# Patient Record
Sex: Male | Born: 1970 | Race: White | Hispanic: No | Marital: Single | Smoking: Never smoker
Health system: Southern US, Community
[De-identification: ages and names within clinical notes are randomized; demographics above are authoritative.]

## PROBLEM LIST (undated history)

## (undated) DIAGNOSIS — I10 Essential (primary) hypertension: Secondary | ICD-10-CM

## (undated) DIAGNOSIS — E785 Hyperlipidemia, unspecified: Secondary | ICD-10-CM

## (undated) HISTORY — DX: Essential (primary) hypertension: I10

---
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
## 1999-05-27 ENCOUNTER — Ambulatory Visit (HOSPITAL_COMMUNITY): Admission: RE | Admit: 1999-05-27 | Discharge: 1999-05-27 | Payer: Self-pay | Admitting: Internal Medicine

## 1999-05-27 ENCOUNTER — Encounter: Payer: Self-pay | Admitting: Internal Medicine

## 2005-10-02 ENCOUNTER — Ambulatory Visit: Payer: Self-pay | Admitting: Emergency Medicine

## 2005-10-26 ENCOUNTER — Ambulatory Visit: Payer: Self-pay | Admitting: Emergency Medicine

## 2006-01-23 ENCOUNTER — Ambulatory Visit: Payer: Self-pay | Admitting: Emergency Medicine

## 2006-01-30 ENCOUNTER — Ambulatory Visit: Payer: Self-pay | Admitting: Cardiovascular Disease

## 2006-02-20 ENCOUNTER — Ambulatory Visit: Payer: Self-pay | Admitting: Emergency Medicine

## 2006-03-05 ENCOUNTER — Ambulatory Visit: Payer: Self-pay | Admitting: Emergency Medicine

## 2006-03-05 ENCOUNTER — Encounter (INDEPENDENT_AMBULATORY_CARE_PROVIDER_SITE_OTHER): Payer: Self-pay | Admitting: Specialist

## 2006-03-05 ENCOUNTER — Ambulatory Visit (HOSPITAL_COMMUNITY): Admission: RE | Admit: 2006-03-05 | Discharge: 2006-03-05 | Payer: Self-pay | Admitting: Emergency Medicine

## 2007-03-20 DIAGNOSIS — R059 Cough, unspecified: Secondary | ICD-10-CM | POA: Insufficient documentation

## 2007-03-20 DIAGNOSIS — R05 Cough: Secondary | ICD-10-CM | POA: Insufficient documentation

## 2007-03-20 DIAGNOSIS — E785 Hyperlipidemia, unspecified: Secondary | ICD-10-CM | POA: Insufficient documentation

## 2007-03-20 DIAGNOSIS — J309 Allergic rhinitis, unspecified: Secondary | ICD-10-CM | POA: Insufficient documentation

## 2008-01-13 IMAGING — CT CT CHEST W/O CM
3 of 5 series · 16 of 36 positions shown, 19 images · IV contrast (agent unspecified)
Comparison: None.

CLINICAL DATA: 17-pound weight loss.  Cough.  Substernal pressure.  
 CHEST CT WITHOUT CONTRAST:
TECHNIQUE: Multidetector CT imaging of the chest was performed following the standard protocol without IV contrast.

[Series 2: chest_hi_res 5.0 b40f st · axial · 0.75mm/px · z∈[-339,-94]mm · 9 of 63 slices shown, 12 images]
[im 7/63  mediastinal]
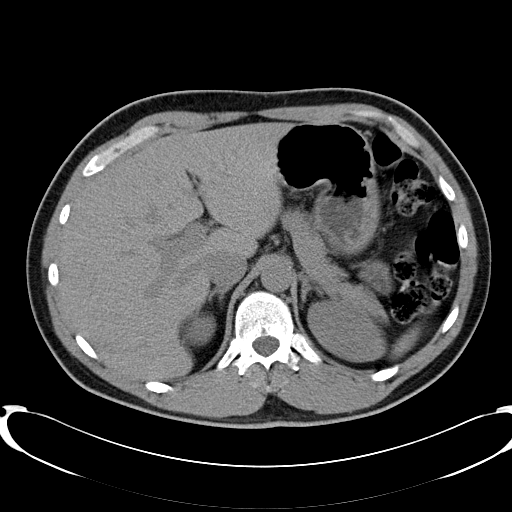
[im 7/63  lung]
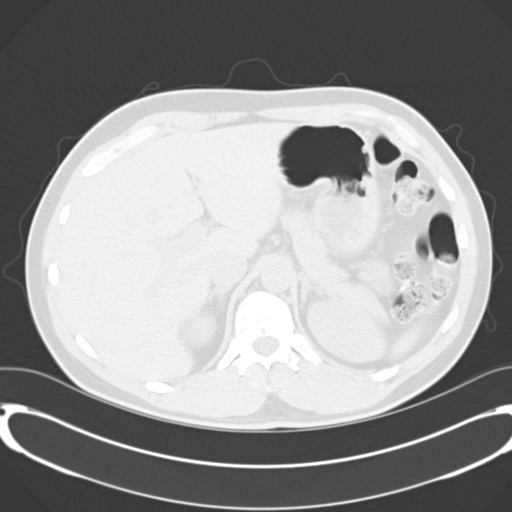
[im 13/63  lung]
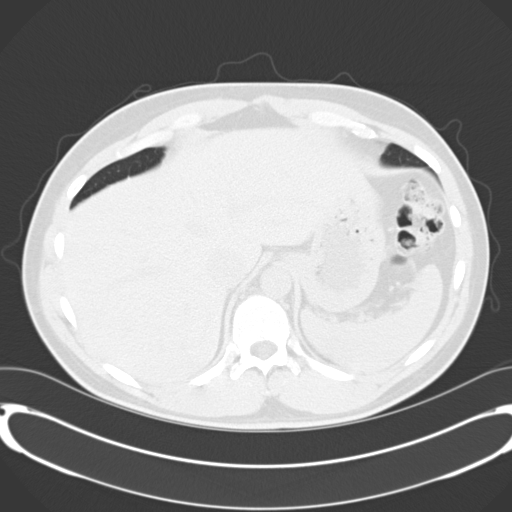
[im 19/63  lung]
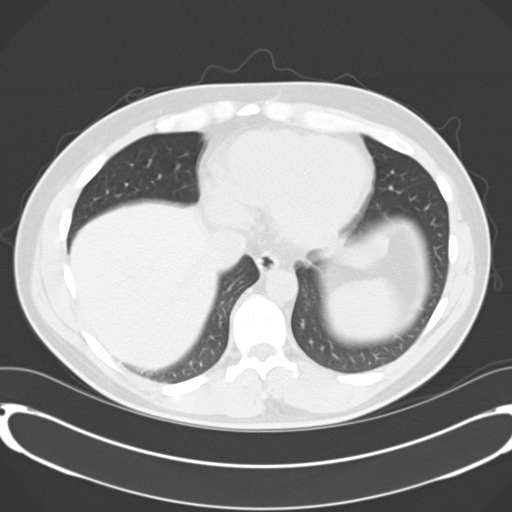
[im 25/63  lung]
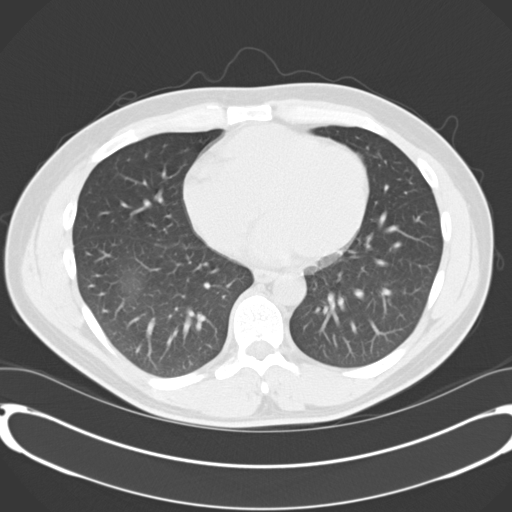
[im 32/63  mediastinal]
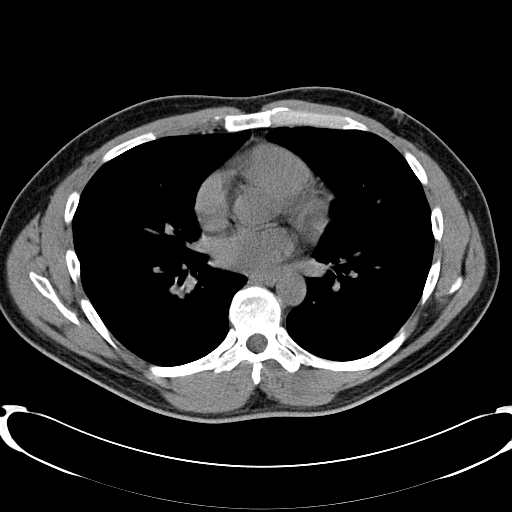
[im 32/63  lung]
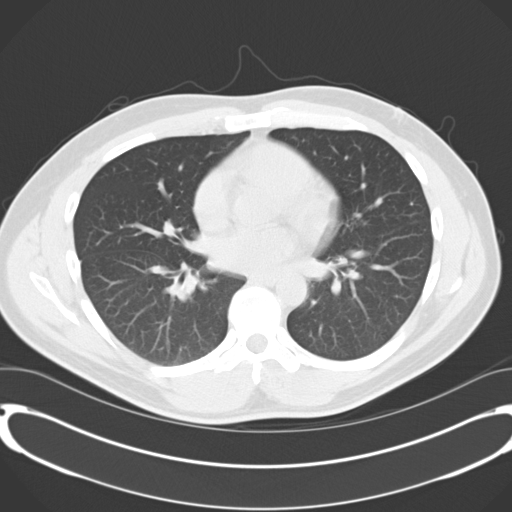
[im 38/63  lung]
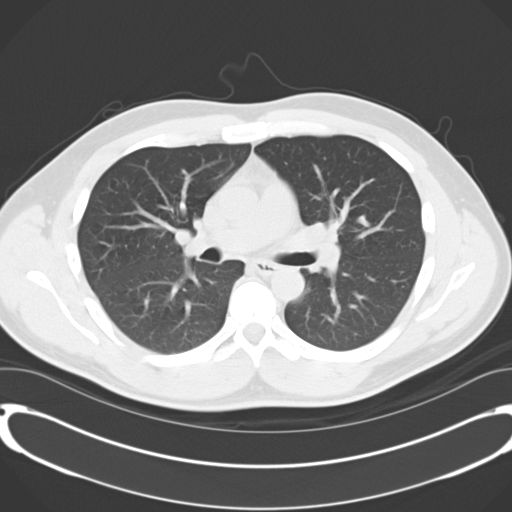
[im 44/63  lung]
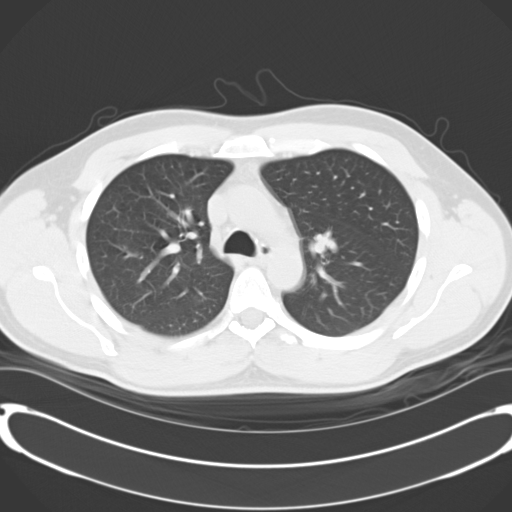
[im 50/63  lung]
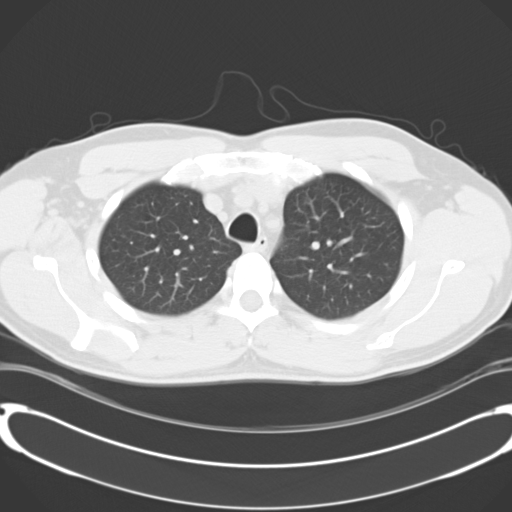
[im 56/63  mediastinal]
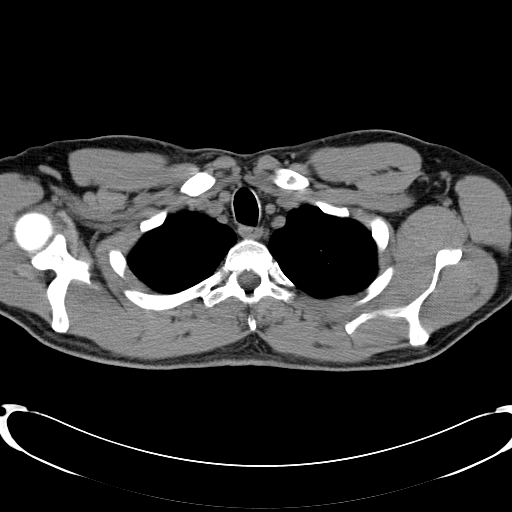
[im 56/63  lung]
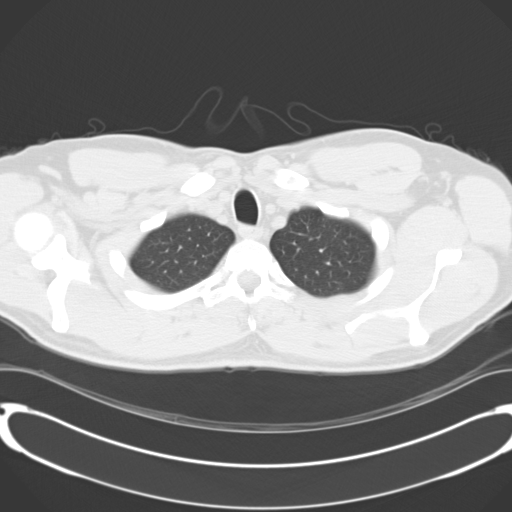

[Series 4: chest_hi_(id) b80f high res lung · axial · 0.75mm/px · z∈[-304,-124]mm · 4 of 32 slices shown]
[im 7/32  lung]
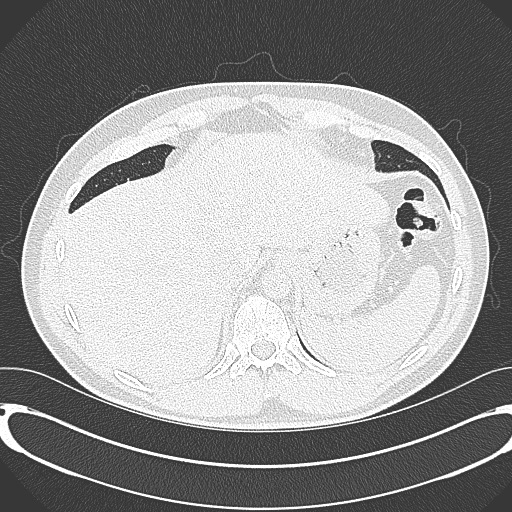
[im 13/32  lung]
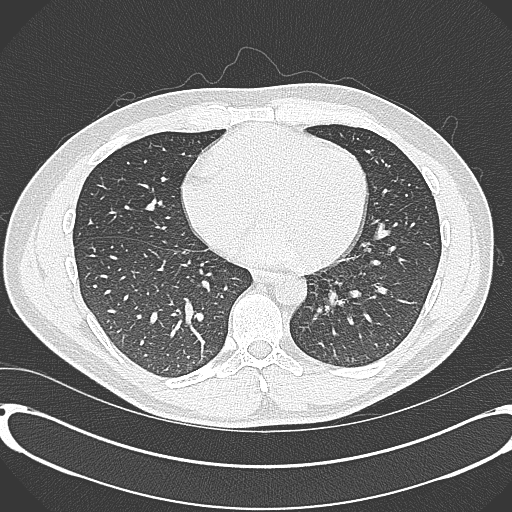
[im 19/32  lung]
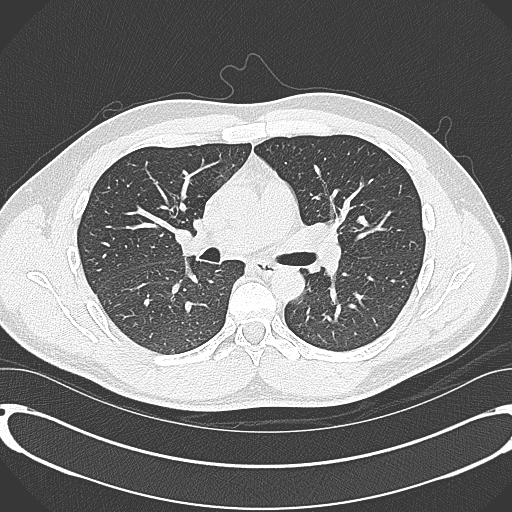
[im 25/32  lung]
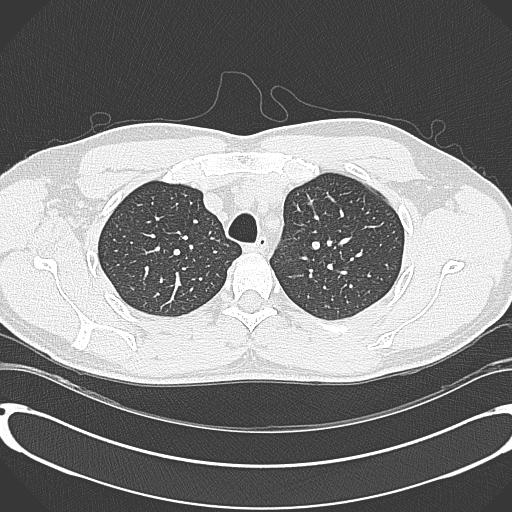

[Series 602: <mpr range> · coronal · 0.75mm/px · 3 of 39 slices shown]
[im 8/39  lung]
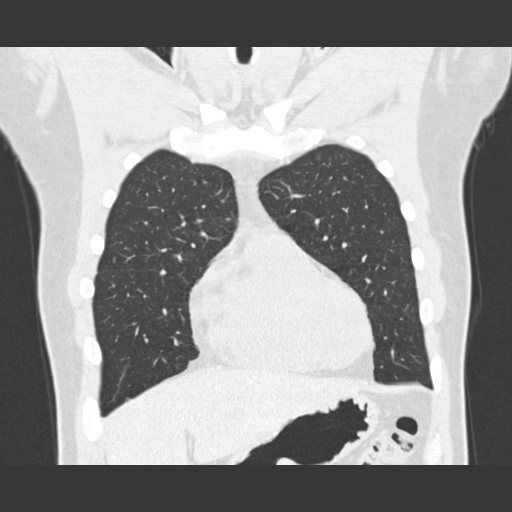
[im 16/39  lung]
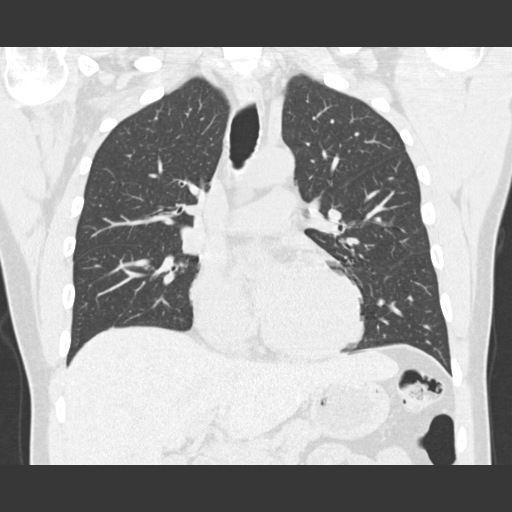
[im 23/39  lung]
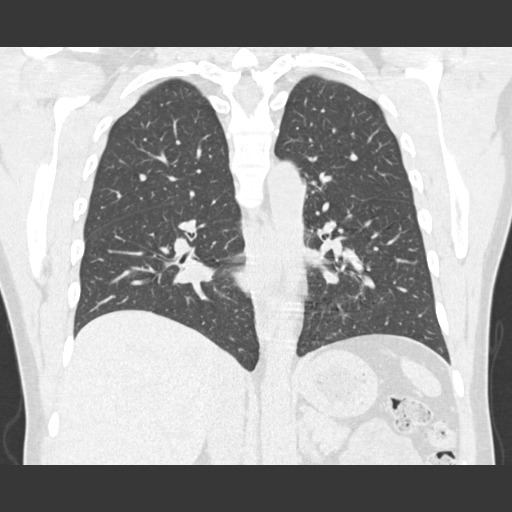

[16 of 36 positions shown; findings below may reference images not displayed]

FINDINGS: There is no pleural or pericardial effusion.  No hilar, axillary or mediastinal lymphadenopathy.  Heart size is normal.  The lungs are clear.  There is no evidence of pulmonary fibrosis or other interstitial lung disease.  No focal bony abnormality is identified with some small Schmorl?s nodes seen.  Incidentally imaged upper abdomen is normal.
IMPRESSION: Normal chest CT.

## 2008-11-22 ENCOUNTER — Encounter: Payer: Self-pay | Admitting: Internal Medicine

## 2009-04-05 ENCOUNTER — Encounter: Payer: Self-pay | Admitting: Internal Medicine

## 2009-07-14 ENCOUNTER — Encounter: Payer: Self-pay | Admitting: Internal Medicine

## 2009-07-20 ENCOUNTER — Ambulatory Visit: Payer: Self-pay | Admitting: Internal Medicine

## 2009-08-26 ENCOUNTER — Ambulatory Visit: Payer: Self-pay | Admitting: Internal Medicine

## 2009-08-26 ENCOUNTER — Ambulatory Visit: Payer: Self-pay

## 2010-06-28 NOTE — Assessment & Plan Note (Signed)
Summary: dx:cad   Primary Provider:  Georgette Shell at [facility]  CC:  family Hx.  History of Present Illness: 40 y/o male with h/o hyperlipidemia and very strong family history of premature CAD referred for screening for ischemic heart disease.  Had treadmill test for severe CP about 10 years with Dr. Oliver Barre. That was normal.   Very active works as a Buyer, retail. Walks 15-20 miles per day. Started running last year. Runs 3 miles 3-4x/week. No CP/SOB. Ran half marathon in 1:40. Has lost 25 pounds in last year and a half.   Very strong FHx of CAD on both sides but most of relatives smokers or diabetics.  Recent LDL 115 so lipitor increased 20 to 40.    Preventive Screening-Counseling & Management  Alcohol-Tobacco     Smoking Status: never  Caffeine-Diet-Exercise     Does Patient Exercise: yes      Drug Use:  yes.    Current Medications (verified): 1)  Aspirin 81 Mg Tbec (Aspirin) .... Take One Tablet By Mouth Daily 2)  Metformin Hcl 500 Mg Tabs (Metformin Hcl) .... Once Daily 3)  Lipitor 20 Mg Tabs (Atorvastatin Calcium) .... Take One Tablet By Mouth Daily.  Allergies (verified): No Known Drug Allergies  Past History:  Past Medical History: Last updated: 07/20/2009 1. COUGH, CHRONIC  2. HYPERLIPIDEMIA 3. ALLERGIC RHINITIS   4. Family Hx of CAD  Family History: Last updated: 07/20/2009 Family History of Coronary Artery Disease:  Strong on both sides of family Family History of Diabetes: grandfather Family History of Hyperlipidemia: both parents Family History of Hypertension: both parents Father alive 64. CAD had MI 62s smoker Grandfather and 2 uncles all died of  1 brother - with DM2, HL and obesity  Social History: Last updated: 07/20/2009 Full Time Married  Tobacco Use - No.  Alcohol Use - no Regular Exercise - yes Drug Use - none  Risk Factors: Exercise: yes (07/20/2009)  Risk Factors: Smoking Status: never (07/20/2009)  Past  Surgical History: shoulder -- tendons  Family History: Reviewed history and no changes required. Family History of Coronary Artery Disease:  Strong on both sides of family Family History of Diabetes: grandfather Family History of Hyperlipidemia: both parents Family History of Hypertension: both parents Father alive 25. CAD had MI 58s smoker Grandfather and 2 uncles all died of  1 brother - with DM2, HL and obesity  Social History: Reviewed history and no changes required. Full Time Married  Tobacco Use - No.  Alcohol Use - no Regular Exercise - yes Drug Use - none Does Patient Exercise:  yes Drug Use:  yes  Review of Systems       As per HPI and past medical history; otherwise all systems negative.   Vital Signs:  Patient profile:   40 year old male Height:      69 inches Weight:      180 pounds BMI:     26.68 Pulse rate:   55 / minute BP sitting:   122 / 78  (left arm) Cuff size:   regular  Vitals Entered By: Hardin Negus, RMA (July 20, 2009 4:11 PM)  Physical Exam  General:  Active healthy appearing. no resp difficulty HEENT: normal Neck: supple. no JVD. Carotids 2+ bilat; no bruits. No lymphadenopathy or thryomegaly appreciated. Cor: PMI nondisplaced. Regular rate & rhythm. No rubs, gallops, murmur. Lungs: clear Abdomen: soft, nontender, nondistended. Good bowel sounds. Extremities: R shouldr surgery scar. no cyanosis, clubbing, rash, edema  Neuro: alert & orientedx3, cranial nerves grossly intact. moves all 4 extremities w/o difficulty. affect pleasant    Impression & Recommendations:  Problem # 1:  SCREENING FOR ISCHEMIC HEART DISEASE (ICD-V81.0) Very active . No symptoms of ischemic heart disease but has extensive FHx. Will get ETT for risk stratification.   Problem # 2:  HYPERLIPIDEMIA (ICD-272.4) Managed by Dr. Karleen Hampshire. Agree with aggressive statin therapy.   Other Orders: Treadmill (Treadmill)  Patient Instructions: 1)  Your physician  has requested that you have an exercise tolerance test.  For further information please visit [email or web address]  Please also follow instruction sheet, as given.

## 2010-06-28 NOTE — Progress Notes (Signed)
Summary: [facility] Office Note  [facility] Office Note   Imported By: Roderic Ovens 11/17/2009 10:33:45  _____________________________________________________________________  External Attachment:    Type:   Image     Comment:   External Document

## 2010-06-28 NOTE — Progress Notes (Signed)
Summary: [facility] Office Note  [facility]  Office Note   Imported By: Roderic Ovens 11/17/2009 10:34:09  _____________________________________________________________________  External Attachment:    Type:   Image     Comment:   External Document

## 2010-06-28 NOTE — Progress Notes (Signed)
Summary: [facility] Office Note  [facility] Office Note   Imported By: Roderic Ovens 11/17/2009 10:33:17  _____________________________________________________________________  External Attachment:    Type:   Image     Comment:   External Document

## 2010-10-14 NOTE — Assessment & Plan Note (Signed)
[facility]                               PULMONARY OFFICE NOTE   Adrian Huber, Adrian Huber                      MRN:          [id number]  DATE:02/20/2006                            DOB:          01/23/1971    SUBJECTIVE:  Adrian Huber is a 40 year old gentleman who has been followed  since May of 2007 with chronic cough.  His cough actually dates back to  November of 2006.  He has been tried empirically on bronchodilators and on  corticosteroids prior to our first appointment.  I have tried him  empirically on gastric acid suppression and on therapy for allergic  rhinitis.  None of these interventions have offered him any significant  relief.  He has now stopped taking his Nexium and his Rhinocort without any  real change in his cough.  He does still cough on a daily basis.  It is  somewhat improved during the day at this time, but it is still very  bothersome in the mornings.  He has a tickle in his throat that is  unchanged.  High resolution CT scan of the chest since our last visit was  completely unremarkable, without any evidence of parenchymal or airways  disease.   EXAM:  IN GENERAL:  This is a pleasant young gentleman in no distress on  room air.  His weight is 178 pounds, temperature 97.8, blood pressure  128/86, heart rate of 67, saturation 99% on room air.  LUNGS:  Clear to auscultation bilaterally.  NECK:  Without strider.  His exam is otherwise completely unremarkable.  He is on no medications  except Zyrtec 10 mg daily.   IMPRESSION:  Chronic cough since November of 2006 that has not had a clear  etiology and has not responded to empiric conservative therapy.   PLAN:  1. FiberOptic bronchoscopy to rule out any airways abnormality.  2. If the above is unrevealing, then I would perform a provocational      study, probably a methacholine to rule out occult airflow limitation.                                   Leslye Peer, MD   RSB/MedQ  DD:  02/20/2006  DT:  02/22/2006  Job #:  [id number]   cc:   [place], [place] [address] [facility], [phone]

## 2010-10-14 NOTE — Assessment & Plan Note (Signed)
[facility]                               PULMONARY OFFICE NOTE   MONTERRIO, GERST                      MRN:          [id number]  DATE:01/23/2006                            DOB:          09-06-1970    SUBJECTIVE:  [name] is a 40 year old man seen in May 2007 for chronic  cough.  He returns today telling me that he continues to have a  nonproductive cough and a tickle in his throat.  He is taking Nexium 40 mg  twice a day and also Zyrtec.  We had discussed increasing his regimen for  allergic rhinitis if he continued to cough but unfortunately when he  contacted our office, we did not follow up on these plans.  He has not  started a nasal steroid or nasal saline washes as we had initially planned  to do if the cough continued.  He is otherwise doing well.  He continues to  work.  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   CURRENT MEDICATIONS:  1. Nexium 40 mg b.i.d.  2. Zyrtec 10 mg daily.   PHYSICAL EXAMINATION:  This is a well-appearing, pleasant young man who is  in no distress on room air.  HEENT:  There is no stridor or lymphadenopathy.  LUNGS:  Clear to auscultation bilaterally without wheezing.  HEART:  Regular rate and rhythm without murmur.  ABDOMEN:  Benign.  EXTREMITIES:  No cyanosis, clubbing, or edema.  NEUROLOGIC:  Grossly nonfocal exam.   As detailed in previous notes, his spirometry is for the most part normal  with only some very mild evidence for air flow limitation based on his FEV1  to FVC ratio.   IMPRESSION:  Chronic cough that persists despite adequate therapy for GERD  and at least partial therapy for his allergic rhinitis.   RECOMMENDATIONS:  1. High resolution CT scan of the chest to rule out bronchiolitis or      bronchiectasis given his mild air flow limitation on spirometry.  2. Add a nasal steroid to his Zyrtec.  We will start Nasacort AQ two      sprays each nostril daily.  3. Continue his Nexium at the current doses.  4. Consider a methacholine challenge if the patient continues to have      cough in absence of other explanation and if we remain  concerned about      possible occult air flow limitation.   I will follow up with Mr. Elvin after his high resolution CT scan of the  chest has been completed to review the results and to see if he has had any  benefit with the addition of his nasal steroid.                                   Leslye Peer, MD   RSB/MedQ  DD:  01/23/2006  DT:  01/24/2006  Job #:  [id number]   cc:   Dr. Andi Devon

## 2010-10-14 NOTE — Op Note (Signed)
NAME:  BRENNEN, Adrian Huber               ACCOUNT NO.:  [id number]   MEDICAL RECORD NO.:  [id number]          PATIENT TYPE:  AMB   LOCATION:  ENDO                         FACILITY:  [facility]   PHYSICIAN:  Leslye Peer, MD    DATE OF BIRTH:  04/05/1971   DATE OF PROCEDURE:  03/05/2006  DATE OF DISCHARGE:                                 OPERATIVE REPORT   PROCEDURE:  Fiberoptic bronchoscopy with biopsies and brushings.   OPERATOR:  Leslye Peer, MD   INDICATIONS FOR PROCEDURE:  The indication is chronic cough.  A consent was  obtained from the patient and a signed copy is on his hospital chart.   MEDICATIONS GIVEN:  1. Fentanyl 100 mcg IV in divided doses.  2. Versed 3 mg IV in divided doses.  3. Lidocaine 1%, 20 mL total, to the bronchoalveolar tree.  4. Cetacaine spray to the posterior pharynx x1.   PROCEDURE DETAILS:  An informed consent was obtained as above , and  conscious sedation was then initiated as detailed above.  Prior to local  anesthesia in the posterior pharynx,  the fiberoptic bronchoscope was  introduced through the oropharynx and the glottis and cords were inspected.  There was a small amount of exudative mucus present, but the cords moved  normally with phonation, deep inspiration and humming.  No polyps or  abnormalities were seen.  The bronchoscope was withdrawn and Cetacaine spray  was then introduced and the posterior pharynx.  The bronchoscope was  withdrawn  and Cetacaine spray was then introduced into the posterior  pharynx.  The bronchoscope was re-introduced through the oropharynx and the  trachea was intubated.  Lidocaine 1% was instilled into all airways for  local anesthesia.  The main carina was normal.  The right mainstem bronchus  and the entire right-sided exam was normal as well, without any evidence of  endobronchial lesion or abnormal secretions.  The left-sided exam showed  normal left lower lobe bronchus and segmental bronchi.  The lingula  was  quite prominent and was easily seen.  In contrast, the left upper lobe  bronchus was somewhat small and was laterally displaced.  I suspect that  this represents a normal anatomical variant.  The entrance to the left upper  lobe was slightly hypopigmented.  This observation and its lateral  displacement me to take endobronchial biopsies from the opening of the left  upper lobe bronchus.  Brushings were also obtained from this region and will  be sent for cytology.  Again, there were no masses noted on the entire left-  sided exam and there were no abnormal secretions.   The patient tolerated the procedure quite well.  There were no  complications, and blood loss was negligible.  He returned to the recovery  room in good condition.   SAMPLES:  1. Endobronchial biopsies from the left upper lobe.  2. Bronchial brushings from the left upper lobe.   PLAN:  Will follow up Mr. Watters's pathology and cytology results with him  by phone once they become available later this week.  ______________________________  Leslye Peer, MD     RSB/MEDQ  D:  03/05/2006  T:  03/06/2006  Job:  [id number]

## 2010-12-05 ENCOUNTER — Telehealth: Payer: Self-pay | Admitting: Internal Medicine

## 2010-12-05 NOTE — Telephone Encounter (Signed)
Stress faxed to [facility] [address] @ [phone]  12/05/10/km

## 2015-06-16 ENCOUNTER — Encounter: Payer: Self-pay | Admitting: Cardiology

## 2016-11-26 DEATH — deceased

## 2017-02-10 ENCOUNTER — Emergency Department (HOSPITAL_COMMUNITY): Payer: BLUE CROSS/BLUE SHIELD

## 2017-02-10 ENCOUNTER — Encounter (HOSPITAL_COMMUNITY): Payer: Self-pay | Admitting: Emergency Medicine

## 2017-02-10 ENCOUNTER — Emergency Department (HOSPITAL_COMMUNITY)
Admission: EM | Admit: 2017-02-10 | Discharge: 2017-02-11 | Disposition: A | Discharge status: Home / Self Care | Payer: BLUE CROSS/BLUE SHIELD | Attending: Emergency Medicine | Admitting: Emergency Medicine

## 2017-02-10 DIAGNOSIS — R1010 Upper abdominal pain, unspecified: Secondary | ICD-10-CM | POA: Insufficient documentation

## 2017-02-10 DIAGNOSIS — Z7982 Long term (current) use of aspirin: Secondary | ICD-10-CM | POA: Diagnosis not present

## 2017-02-10 DIAGNOSIS — R072 Precordial pain: Secondary | ICD-10-CM | POA: Diagnosis not present

## 2017-02-10 DIAGNOSIS — R079 Chest pain, unspecified: Secondary | ICD-10-CM | POA: Diagnosis present

## 2017-02-10 DIAGNOSIS — Z79899 Other long term (current) drug therapy: Secondary | ICD-10-CM | POA: Diagnosis not present

## 2017-02-10 HISTORY — DX: Hyperlipidemia, unspecified: E78.5

## 2017-02-10 LAB — BASIC METABOLIC PANEL
ANION GAP: 7 (ref 5–15)
BUN: 15 mg/dL (ref 6–20)
CHLORIDE: 101 mmol/L (ref 101–111)
CO2: 27 mmol/L (ref 22–32)
Calcium: 9.8 mg/dL (ref 8.9–10.3)
Creatinine, Ser: 1.04 mg/dL (ref 0.61–1.24)
GFR calc non Af Amer: >60 mL/min (ref >60)
GLUCOSE: 126 mg/dL — AB (ref 65–99)
POTASSIUM: 4.1 mmol/L (ref 3.5–5.1)
Sodium: 135 mmol/L (ref 135–145)

## 2017-02-10 LAB — CBC
HCT: 45.1 % (ref 39.0–52.0)
Hemoglobin: 15.2 g/dL (ref 13.0–17.0)
MCH: 29.5 pg (ref 26.0–34.0)
MCHC: 33.7 g/dL (ref 30.0–36.0)
MCV: 87.6 fL (ref 78.0–100.0)
PLATELETS: 301 10*3/uL (ref 150–400)
RBC: 5.15 MIL/uL (ref 4.22–5.81)
RDW: 13.7 % (ref 11.5–15.5)
WBC: 9.2 10*3/uL (ref 4.0–10.5)

## 2017-02-10 LAB — I-STAT TROPONIN, ED: Troponin i, poc: 0 ng/mL (ref 0.00–0.08)

## 2017-02-10 NOTE — ED Triage Notes (Signed)
Pt c/o 8/10 mid cp radiating to his back that stated today with some vomiting today, no cardiac hx.

## 2017-02-11 ENCOUNTER — Other Ambulatory Visit: Payer: Self-pay

## 2017-02-11 LAB — I-STAT TROPONIN, ED: TROPONIN I, POC: 0 ng/mL (ref 0.00–0.08)

## 2017-02-11 NOTE — ED Provider Notes (Signed)
[facility] DEPT Provider Note   CSN: [id number] Arrival date & time: 02/10/17  2036     History   Chief Complaint Chief Complaint  Patient presents with  . Chest Pain    HPI Adrian Huber is a 46 y.o. male.  HPI Patient is a 46 year old male with a history of hyperlipidemia and family history of early cardiac disease who presents emergency department with an episode of upper abdominal and lower chest discomfort with radiation through to his back.  This occurred this evening his been present for several hours.  His symptoms resolved in the emergency department waiting room.  Patient had similar symptoms transiently at the night before.  No associate shortness of breath.  No diaphoresis.  Tonight he became nauseated and vomited one time.  He's never been told he has gallstones.  He's never really had pain like this before.  He drinks 1 alcoholic beverage a night but does not drink heavier than that.  He works for a [employer] and walks daily and then never has chest pain or shortness of breath with walking.  No recent fatigue or weakness.  Asymptomatic at this time.   Past Medical History:  Diagnosis Date  . Hyperlipemia     Patient Active Problem List   Diagnosis Date Noted  . HYPERLIPIDEMIA 03/20/2007  . ALLERGIC RHINITIS 03/20/2007  . COUGH, CHRONIC 03/20/2007    History reviewed. No pertinent surgical history.     Home Medications    Prior to Admission medications   Medication Sig Start Date End Date Taking? Authorizing Provider  aspirin 81 MG EC tablet Take 81 mg by mouth every evening.   Yes [provider]  atorvastatin (LIPITOR) 20 MG tablet Take 20 mg by mouth every evening. 01/12/17  Yes [provider]  Omega-3 Fatty Acids (EQL OMEGA 3 FISH OIL) 1400 MG CAPS Take 1,400 mg by mouth every other day.   Yes [provider]    Family History History reviewed. No pertinent family history.  Social History Social History    Substance Use Topics  . Smoking status: Never Smoker  . Smokeless tobacco: Never Used  . Alcohol use 0.6 oz/week    1 Cans of beer per week     Allergies   Patient has no known allergies.   Review of Systems Review of Systems  All other systems reviewed and are negative.    Physical Exam Updated Vital Signs BP 129/87   Pulse (!) 51   Temp 98.2 F (36.8 C) (Oral)   Resp 12   Ht 5\' 8"  (1.727 m)   Wt 91.6 kg (202 lb)   SpO2 99%   BMI 30.71 kg/m   Physical Exam  Constitutional: He is oriented to person, place, and time. He appears well-developed and well-nourished.  HENT:  Head: Normocephalic.  Eyes: EOM are normal.  Neck: Normal range of motion.  Cardiovascular: Normal rate, regular rhythm and normal heart sounds.   Pulmonary/Chest: Effort normal and breath sounds normal. No respiratory distress.  Abdominal: He exhibits no distension.  Musculoskeletal: Normal range of motion.  Neurological: He is alert and oriented to person, place, and time.  Psychiatric: He has a normal mood and affect.  Nursing note and vitals reviewed.    ED Treatments / Results  Labs (all labs ordered are listed, but only abnormal results are displayed) Labs Reviewed  BASIC METABOLIC PANEL - Abnormal; Notable for the following:       Result Value  Glucose, Bld 126 (*)    All other components within normal limits  CBC  I-STAT TROPONIN, ED  I-STAT TROPONIN, ED    EKG  EKG Interpretation #1 Date/Time:  Saturday February 10 2017 20:43:08 EDT Ventricular Rate:  55 PR Interval:  150 QRS Duration: 112 QT Interval:  432 QTC Calculation: 413 R Axis:   -23 Text Interpretation:  Sinus bradycardia Otherwise normal ECG No old tracing to compare Confirmed by Delora Fuel ([id number]) on 02/10/2017 11:02:55 PM       EKG Interpretation  Date/Time:  Sunday February 11 2017 00:07:16 EDT Ventricular Rate:  49 PR Interval:  150 QRS Duration: 121 QT Interval:  466 QTC Calculation: 421 R  Axis:   -27 Text Interpretation:  Sinus bradycardia Nonspecific intraventricular conduction delay No significant change was found Confirmed by Jola Schmidt [phone]) on 02/11/2017 12:56:01 AM         Radiology Dg Chest 2 View  Result Date: 02/10/2017 CLINICAL DATA:  Mid chest pain radiating to the back starting today with vomiting. EXAM: CHEST  2 VIEW COMPARISON:  947 chest CT FINDINGS: The heart size and mediastinal contours are within normal limits. Both lungs are clear. The visualized skeletal structures are unremarkable. IMPRESSION: No active cardiopulmonary disease. Electronically Signed   By: Ashley Royalty M.D.   On: 02/10/2017 22:07    Procedures Procedures (including critical care time)  Medications Ordered in ED Medications - No data to display   Initial Impression / Assessment and Plan / ED Course  I have reviewed the triage vital signs and the nursing notes.  Pertinent labs & imaging results that were available during my care of the patient were reviewed by me and considered in my medical decision making (see chart for details).     Patient is overall well-appearing.  A central Magnevist time.  EKG 2 negative for ischemic changes.  Troponin negative 2.  Resolution of symptoms in the emergency department.  May represent gastroesophageal reflux disease.  Basic bedside ultrasound demonstrates no evidence of gallstones.  Primary care follow-up.  Patient understands return to the ER for new or worsening symptoms.  Final Clinical Impressions(s) / ED Diagnoses   Final diagnoses:  Precordial chest pain  Upper abdominal pain    New Prescriptions New Prescriptions   No medications on file     Jola Schmidt, MD 02/11/17 [phone]

## 2019-01-24 IMAGING — CR DG CHEST 2V
2 series · 2 of 2 positions shown · non-contrast
Comparison: 947 chest CT

CLINICAL DATA: Mid chest pain radiating to the back starting today
with vomiting.

EXAM:
CHEST  2 VIEW

[chest pa]
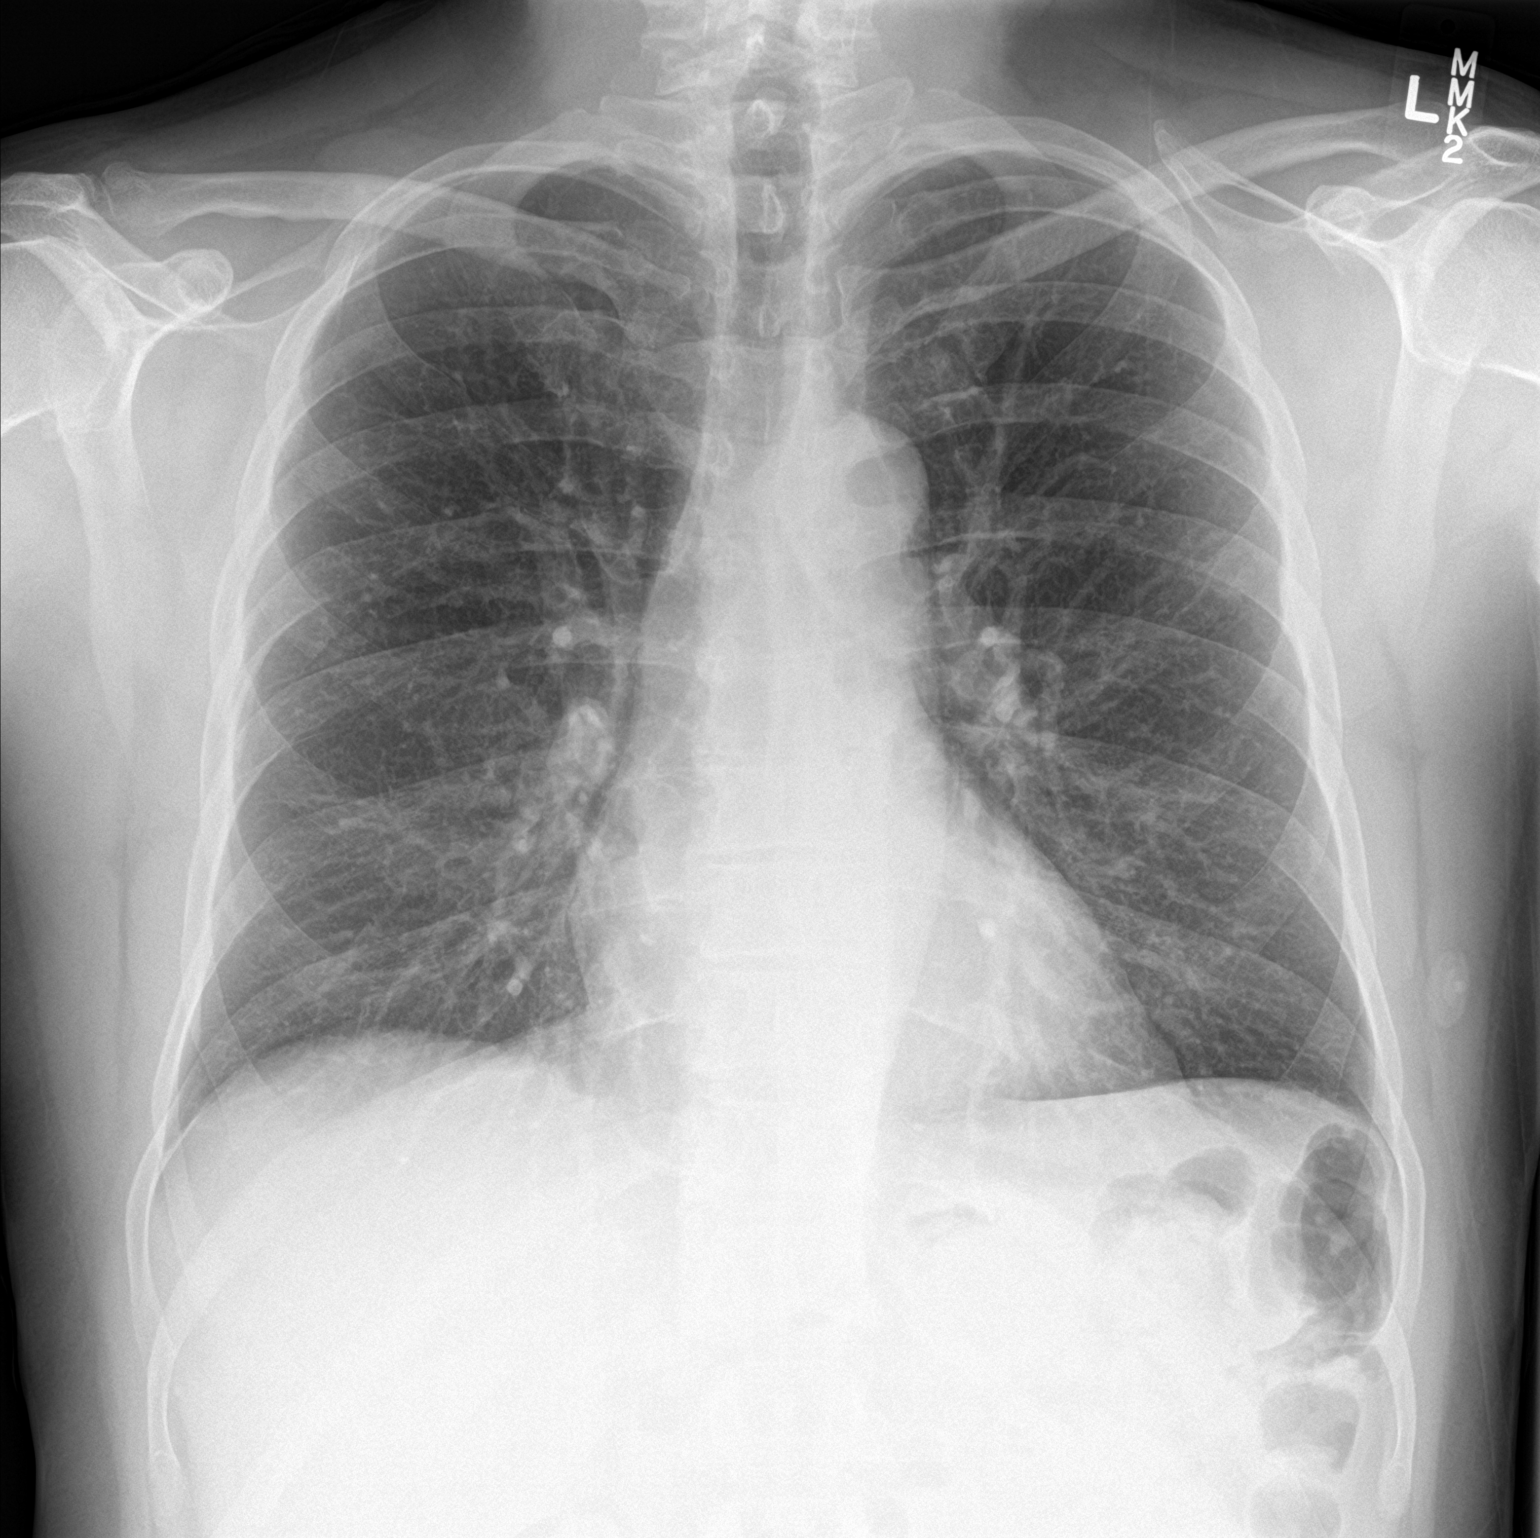

[chest lat]
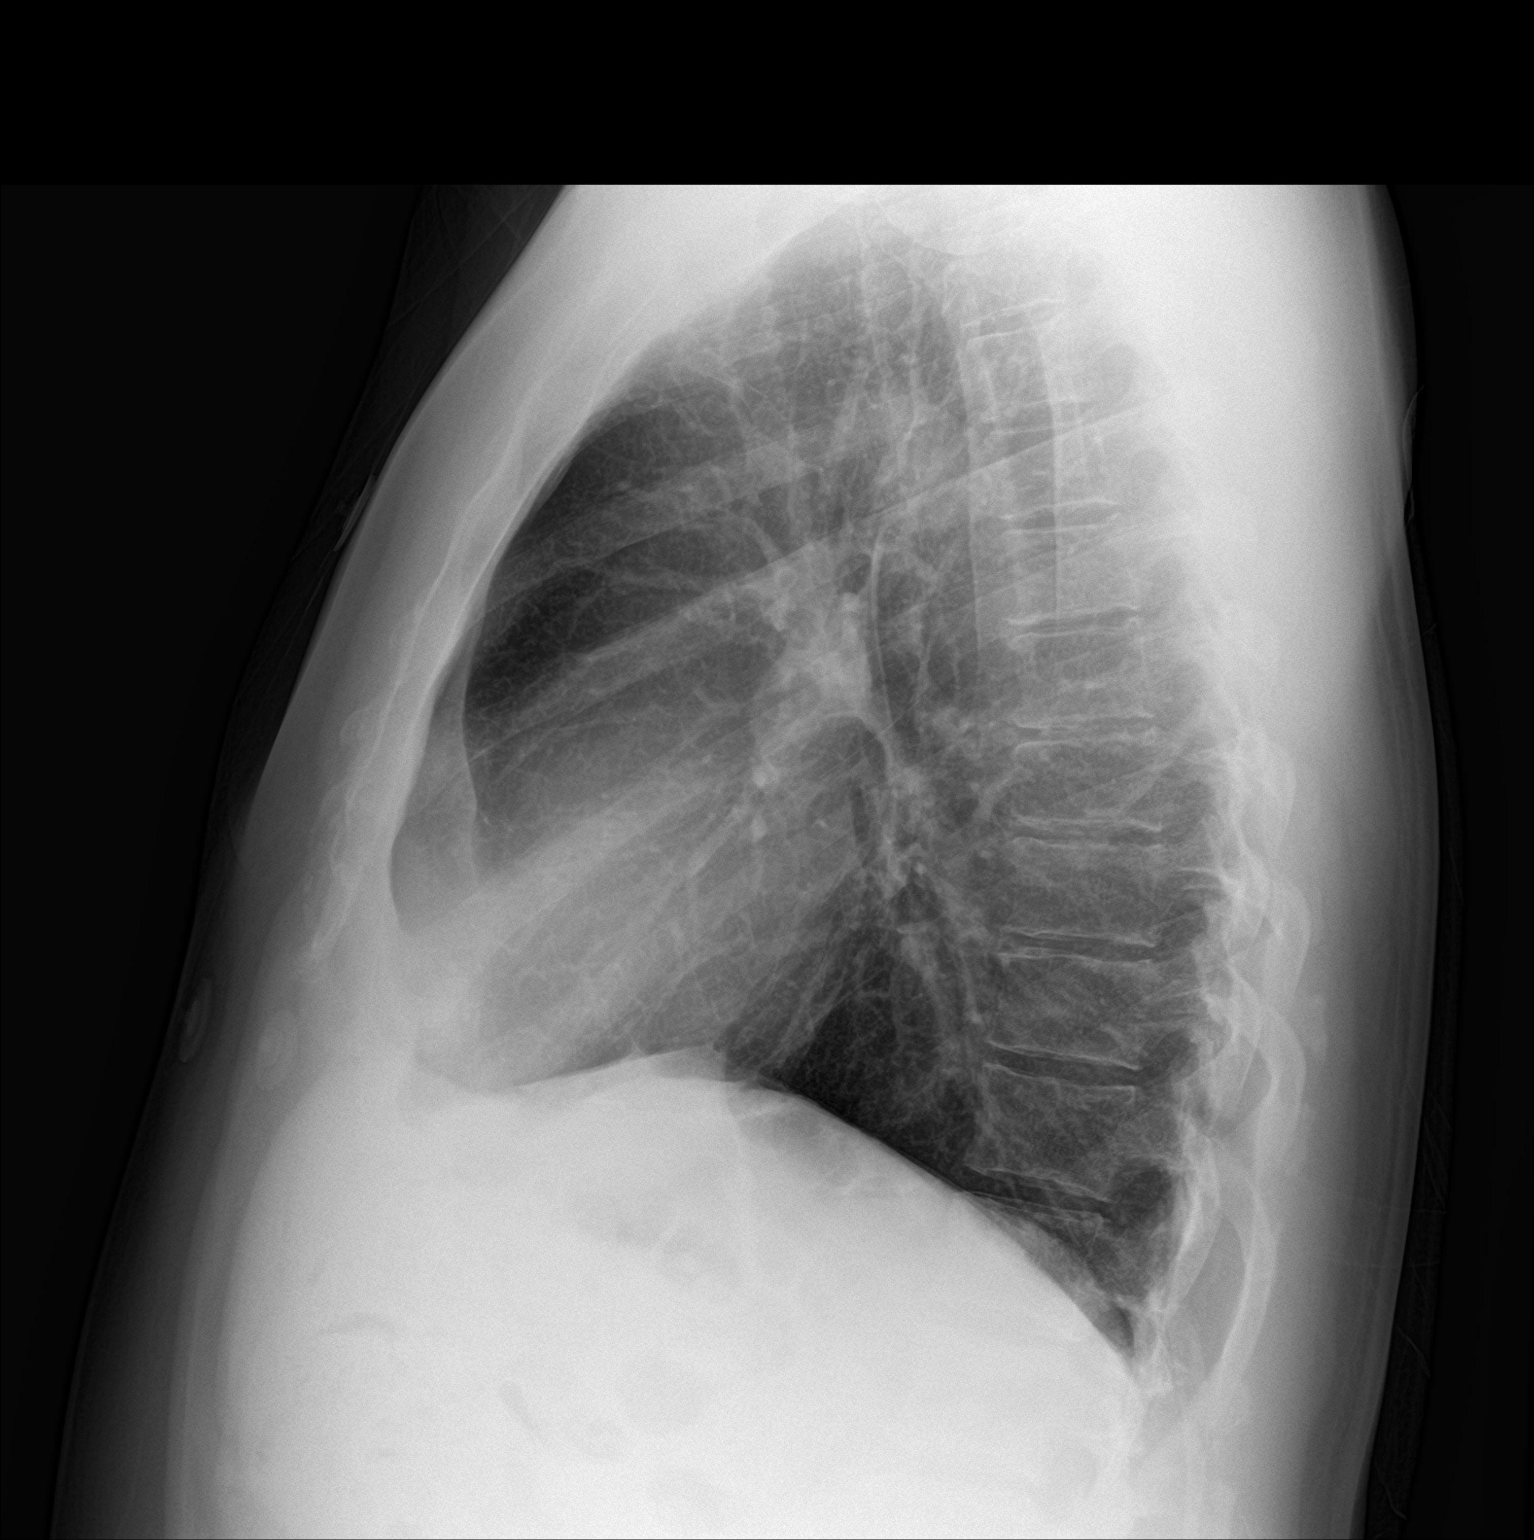

[2 of 2 positions shown; findings below may reference images not displayed]

FINDINGS: The heart size and mediastinal contours are within normal limits.
Both lungs are clear. The visualized skeletal structures are
unremarkable.
IMPRESSION: No active cardiopulmonary disease.

## 2020-08-04 ENCOUNTER — Ambulatory Visit (INDEPENDENT_AMBULATORY_CARE_PROVIDER_SITE_OTHER): Payer: 59 | Admitting: Dermatology

## 2020-08-04 ENCOUNTER — Other Ambulatory Visit: Payer: Self-pay

## 2020-08-04 ENCOUNTER — Encounter: Payer: Self-pay | Admitting: Dermatology

## 2020-08-04 DIAGNOSIS — D1801 Hemangioma of skin and subcutaneous tissue: Secondary | ICD-10-CM | POA: Diagnosis not present

## 2020-08-04 DIAGNOSIS — D2362 Other benign neoplasm of skin of left upper limb, including shoulder: Secondary | ICD-10-CM | POA: Diagnosis not present

## 2020-08-04 DIAGNOSIS — L570 Actinic keratosis: Secondary | ICD-10-CM | POA: Diagnosis not present

## 2020-08-04 DIAGNOSIS — Z1283 Encounter for screening for malignant neoplasm of skin: Secondary | ICD-10-CM

## 2020-08-04 DIAGNOSIS — D239 Other benign neoplasm of skin, unspecified: Secondary | ICD-10-CM

## 2020-08-04 NOTE — Patient Instructions (Signed)
Contact the office via [place] in October to start topical treatment for forehead.

## 2020-08-24 ENCOUNTER — Encounter: Payer: Self-pay | Admitting: Dermatology

## 2020-08-24 NOTE — Progress Notes (Signed)
   New Patient   Subjective  Adrian Huber is a 50 y.o. male who presents for the following: Annual Exam (Full body skin check. Spot on left temple x unknown scaly, tender to touch, no bleeding./Several spots on the neck x year previous derm dr told him if it got hard he needed to come back in. ).  General skin examination Location:  Duration:  Quality:  Associated Signs/Symptoms: Modifying Factors:  Severity:  Timing: Context:    The following portions of the chart were reviewed this encounter and updated as appropriate:  Tobacco  Allergies  Meds  Problems  Med Hx  Surg Hx  Fam Hx      Objective  Well appearing patient in no apparent distress; mood and affect are within normal limits. Objective  Head to Ankles: Head to ankles exam today no signs of atypical moles, melanoma or non mole skin cancer.  Objective  Left Forehead, Mid Forehead, Right Forehead: Pink gritty four 6 mm crusts plus more diffuse UV damage.  I suspect he will do better with a field therapy in late fall and winter and freezing the individual spots.  Objective  Chest - Medial [facility]): Multiple vascular 1 to 3 mm smooth red dots.  Objective  Left Shoulder - Posterior: Firm dirty pink 5 mm dermal papule    A full examination was performed including scalp, head, eyes, ears, nose, lips, neck, chest, axillae, abdomen, back, buttocks, bilateral upper extremities, bilateral lower extremities, hands, feet, fingers, toes, fingernails, and toenails. All findings within normal limits unless otherwise noted below.   Assessment & Plan  Skin exam for malignant neoplasm Head to Ankles  Yearly skin check  Solar keratosis (3) Mid Forehead; Left Forehead; Right Forehead  Can try topical therapy in the Fall. Patient will send a MyChart message in October for topical treatment to be sent to the Pharmacy.  Hemangioma of skin Chest - Medial (Center)  Benign okay to leave.  Dermatofibroma Left  Shoulder - Posterior  Benign okay to leave unless patient wants removed or clinically changes

## 2021-03-22 ENCOUNTER — Telehealth: Payer: Self-pay | Admitting: Dermatology

## 2021-03-22 NOTE — Telephone Encounter (Signed)
Was here in March; ST didn't want to freeze places & told him to call back in October and he'd Rx a cream instead. Pharmacy: [facility]

## 2021-03-29 MED ORDER — TOLAK 4 % EX CREA: 1.0000 "application " | TOPICAL_CREAM | Freq: Every day | CUTANEOUS | 1 refills | Status: DC

## 2021-03-29 NOTE — Telephone Encounter (Signed)
Phone call to patient to let him know we are sending in prescription for tolak. He will treat his face. He is to call with any problems or concerns.

## 2021-08-01 ENCOUNTER — Ambulatory Visit: Payer: 59 | Admitting: Dermatology

## 2021-08-09 ENCOUNTER — Ambulatory Visit: Payer: 59 | Admitting: Dermatology

## 2022-08-28 DIAGNOSIS — I639 Cerebral infarction, unspecified: Secondary | ICD-10-CM

## 2022-08-28 HISTORY — DX: Cerebral infarction, unspecified: I63.9

## 2023-06-01 ENCOUNTER — Encounter: Payer: Self-pay | Admitting: Gastroenterology

## 2023-06-21 ENCOUNTER — Ambulatory Visit: Payer: Commercial Managed Care - HMO

## 2023-06-21 VITALS — Ht 68.0 in | Wt 200.0 lb

## 2023-06-21 DIAGNOSIS — Z1211 Encounter for screening for malignant neoplasm of colon: Secondary | ICD-10-CM

## 2023-06-21 MED ORDER — SUFLAVE 178.7 G PO SOLR: 1.0000 | Freq: Once | ORAL | 0 refills | Status: AC

## 2023-06-21 NOTE — Progress Notes (Signed)
Pre visit completed via phone call; Patient verified name, DOB, and address; No egg or soy allergy known to patient  No issues known to pt with past sedation with any surgeries or procedures; Patient denies ever being told they had issues or difficulty with intubation;  No FH of Malignant Hyperthermia; Pt is not on diet pills; Pt is not on home 02;  Pt is not on blood thinners;  Pt denies issues with constipation  No A fib or A flutter; Have any cardiac testing pending--NO Insurance verified during PV appt--- [employer] Pt can ambulate without assistance;  Pt denies use of chewing tobacco; Discussed diabetic/weight loss medication holds; Discussed NSAID holds; Checked BMI to be less than 50; Pt instructed to use [employer] or GoodRx for a price reduction on prep;  Patient's chart reviewed by Cathlyn Parsons CNRA prior to previsit and patient appropriate for the [facility];  Pre visit completed and red dot placed by patient's name on their procedure day (on provider's schedule);    Instructions sent to MyChart as well as printed and mailed to the patient per his request;

## 2023-07-05 ENCOUNTER — Encounter: Payer: Self-pay | Admitting: Gastroenterology

## 2023-07-13 ENCOUNTER — Encounter: Payer: Self-pay | Admitting: Gastroenterology

## 2023-07-13 ENCOUNTER — Ambulatory Visit: Payer: Commercial Managed Care - HMO | Admitting: Gastroenterology

## 2023-07-13 VITALS — BP 102/62 | HR 55 | Temp 97.6°F | Resp 12 | Ht 68.0 in | Wt 200.0 lb

## 2023-07-13 DIAGNOSIS — K640 First degree hemorrhoids: Secondary | ICD-10-CM | POA: Diagnosis not present

## 2023-07-13 DIAGNOSIS — Z1211 Encounter for screening for malignant neoplasm of colon: Secondary | ICD-10-CM | POA: Diagnosis present

## 2023-07-13 DIAGNOSIS — K5730 Diverticulosis of large intestine without perforation or abscess without bleeding: Secondary | ICD-10-CM | POA: Diagnosis not present

## 2023-07-13 DIAGNOSIS — D122 Benign neoplasm of ascending colon: Secondary | ICD-10-CM | POA: Diagnosis not present

## 2023-07-13 MED ORDER — SODIUM CHLORIDE 0.9 % IV SOLN: 500.0000 mL | Freq: Once | INTRAVENOUS | Status: AC

## 2023-07-13 NOTE — Patient Instructions (Signed)

## 2023-07-13 NOTE — Progress Notes (Signed)
[name] History and Physical   Primary Care Physician:  Nonnie Done., MD   Reason for Procedure:   CRC screening  Plan:    colon     HPI: Adrian Huber is a 53 y.o. male    Past Medical History:  Diagnosis Date   Hyperlipemia    on meds   Hypertension    on meds   Stroke [facility]) 08/2022   unknown cause    Past Surgical History:  Procedure Laterality Date   SHOULDER ARTHROSCOPY WITH OPEN ROTATOR CUFF REPAIR Right 1990    Prior to Admission medications   Medication Sig Start Date End Date Taking? Authorizing Provider  acetaminophen (TYLENOL) 325 MG tablet Take 325 mg by mouth every 4 (four) hours as needed. 09/19/22  Yes [provider]  atorvastatin (LIPITOR) 40 MG tablet Take 40 mg by mouth daily. 05/18/23  Yes [provider]  cetirizine (ZYRTEC) 5 MG tablet Take 5 mg by mouth daily. 01/22/19  Yes [provider]  lisinopril (ZESTRIL) 10 MG tablet Take 10 mg by mouth daily.   Yes [provider]  Omega-3 Fatty Acids (EQL OMEGA 3 FISH OIL) 1400 MG CAPS Take 1,400 mg by mouth every other day.    [provider]    Current Outpatient Medications  Medication Sig Dispense Refill   acetaminophen (TYLENOL) 325 MG tablet Take 325 mg by mouth every 4 (four) hours as needed.     atorvastatin (LIPITOR) 40 MG tablet Take 40 mg by mouth daily.     cetirizine (ZYRTEC) 5 MG tablet Take 5 mg by mouth daily.     lisinopril (ZESTRIL) 10 MG tablet Take 10 mg by mouth daily.     Omega-3 Fatty Acids (EQL OMEGA 3 FISH OIL) 1400 MG CAPS Take 1,400 mg by mouth every other day.     Current Facility-Administered Medications  Medication Dose Route Frequency Provider Last Rate Last Admin   0.9 %  sodium chloride infusion  500 mL Intravenous Once Lynann Bologna, MD        Allergies as of 07/13/2023   (No Known Allergies)    Family History  Problem Relation Age of Onset   Colon polyps Father 100   Colon cancer Neg Hx     Esophageal cancer Neg Hx    Rectal cancer Neg Hx    Stomach cancer Neg Hx     Social History   Socioeconomic History   Marital status: Single    Spouse name: Not on file   Number of children: Not on file   Years of education: Not on file   Highest education level: Not on file  Occupational History   Not on file  Tobacco Use   Smoking status: Never   Smokeless tobacco: Former    Types: Chew    Quit date: 1990   Tobacco comments:    3 years  Vaping Use   Vaping status: Never Used  Substance and Sexual Activity   Alcohol use: Yes    Alcohol/week: 5.0 standard drinks of alcohol    Types: 5 Cans of beer per week   Drug use: Never   Sexual activity: Not on file  Other Topics Concern   Not on file  Social History Narrative   Not on file   Social Drivers of Health   Financial Resource Strain: Not on file  Food Insecurity: Not on file  Transportation Needs: Not on file  Physical Activity: Not on file  Stress:  Not on file  Social Connections: Not on file  Intimate Partner Violence: Not At Risk (09/13/2022)   Received from [facility]   Intimate Partner Violence    Are you denied basic needs such as food, clothing, or medical care?: No    In the past 12 months have you been in a relationship with a person who hurts, threatens, or tries to control you?: No    Are you denied basic needs such as food, clothing, or medical care?: No    In the past 12 months have you been in a relationship with a person who hurts, threatens, or tries to control you?: No    Review of Systems: Positive for none All other review of systems negative except as mentioned in the HPI.  Physical Exam: Vital signs in last 24 hours: @VSRANGES @   General:   Alert,  Well-developed, well-nourished, pleasant and cooperative in NAD Lungs:  Clear throughout to auscultation.   Heart:  Regular rate and rhythm; no murmurs, clicks, rubs,  or gallops. Abdomen:  Soft, nontender and nondistended.  Normal bowel sounds.   Neuro/Psych:  Alert and cooperative. Normal mood and affect. A and O x 3    No significant changes were identified.  The patient continues to be an appropriate candidate for the planned procedure and anesthesia.   Edman Circle, MD. [facility] 07/13/2023 9:23 AM@

## 2023-07-13 NOTE — Progress Notes (Signed)
Called to room to assist during endoscopic procedure.  Patient ID and intended procedure confirmed with present staff. Received instructions for my participation in the procedure from the performing physician.

## 2023-07-13 NOTE — Op Note (Signed)
[facility] Patient Name: Adrian Huber Procedure Date: 07/13/2023 9:24 AM MRN: [id number] Endoscopist: Lynann Bologna , MD, [id number] Age: 53 Referring MD:  Date of Birth: 08-24-1970 Gender: Male Account #: [id number] Procedure:                Colonoscopy Indications:              Screening for colorectal malignant neoplasm Medicines:                Monitored Anesthesia Care Procedure:                Pre-Anesthesia Assessment:                           - Prior to the procedure, a History and Physical                            was performed, and patient medications and                            allergies were reviewed. The patient's tolerance of                            previous anesthesia was also reviewed. The risks                            and benefits of the procedure and the sedation                            options and risks were discussed with the patient.                            All questions were answered, and informed consent                            was obtained. Prior Anticoagulants: The patient has                            taken no anticoagulant or antiplatelet agents. ASA                            Grade Assessment: I - A normal, healthy patient.                            After reviewing the risks and benefits, the patient                            was deemed in satisfactory condition to undergo the                            procedure.                           After obtaining informed consent, the colonoscope  was passed under direct vision. Throughout the                            procedure, the patient's blood pressure, pulse, and                            oxygen saturations were monitored continuously. The                            CF [id number] #[id number] was introduced through the anus                            and advanced to the the cecum, identified by                            appendiceal orifice and ileocecal  valve. The                            colonoscopy was performed without difficulty. The                            patient tolerated the procedure well. The quality                            of the bowel preparation was good. The ileocecal                            valve, appendiceal orifice, and rectum were                            photographed. Scope In: 9:27:32 AM Scope Out: 9:42:45 AM Scope Withdrawal Time: 0 hours 11 minutes 30 seconds  Total Procedure Duration: 0 hours 15 minutes 13 seconds  Findings:                 Two sessile polyps were found in the proximal                            ascending colon and mid ascending colon. The polyps                            were 4 to 5 mm in size. These polyps were removed                            with a cold snare. Resection and retrieval were                            complete. Rare diverticula (1-2) in the sigmoid                            colon.                           Non-bleeding internal hemorrhoids were found during  retroflexion. The hemorrhoids were small and Grade                            I (internal hemorrhoids that do not prolapse).                           The exam was otherwise without abnormality on                            direct and retroflexion views. Complications:            No immediate complications. Estimated Blood Loss:     Estimated blood loss: none. Impression:               - Two 4 to 5 mm polyps in the proximal ascending                            colon and in the mid ascending colon, removed with                            a cold snare. Resected and retrieved.                           - Non-bleeding internal hemorrhoids.                           - The examination was otherwise normal on direct                            and retroflexion views. Recommendation:           - Patient has a contact number available for                            emergencies. The signs and  symptoms of potential                            delayed complications were discussed with the                            patient. Return to normal activities tomorrow.                            Written discharge instructions were provided to the                            patient.                           - Resume previous diet.                           - Continue present medications.                           - Await pathology results.                           -  Repeat colonoscopy for surveillance based on                            pathology results.                           - The findings and recommendations were discussed                            with the patient's family. Lynann Bologna, MD 07/13/2023 9:46:01 AM This report has been signed electronically.

## 2023-07-13 NOTE — Progress Notes (Signed)
Pt's states no medical or surgical changes since previsit or office visit.

## 2023-07-13 NOTE — Progress Notes (Signed)
Report to PACU, RN, vss, BBS= Clear.

## 2023-07-16 ENCOUNTER — Telehealth: Payer: Self-pay | Admitting: *Deleted

## 2023-07-16 NOTE — Telephone Encounter (Signed)
  Follow up Call-     07/13/2023    8:51 AM  Call back number  Post procedure Call Back phone  # [phone]  Permission to leave phone message Yes     Patient questions:  Do you have a fever, pain , or abdominal swelling? No. Pain Score  0 *  Have you tolerated food without any problems? Yes.    Have you been able to return to your normal activities? Yes.    Do you have any questions about your discharge instructions: Diet   No. Medications  No. Follow up visit  No.  Do you have questions or concerns about your Care? No.  Actions: * If pain score is 4 or above: No action needed, pain <4.

## 2023-07-17 LAB — SURGICAL PATHOLOGY

## 2023-07-19 ENCOUNTER — Encounter: Payer: Self-pay | Admitting: Gastroenterology
